# Patient Record
Sex: Female | Born: 1990 | Race: White | Hispanic: No | Marital: Single | Smoking: Never smoker
Health system: Southern US, Community
[De-identification: ages and names within clinical notes are randomized; demographics above are authoritative.]

---

## 2014-05-25 ENCOUNTER — Emergency Department (HOSPITAL_COMMUNITY)
Admission: EM | Admit: 2014-05-25 | Discharge: 2014-05-26 | Disposition: A | Discharge status: Home / Self Care | Payer: BC Managed Care – PPO | Attending: Emergency Medicine | Admitting: Emergency Medicine

## 2014-05-25 ENCOUNTER — Encounter (HOSPITAL_COMMUNITY): Payer: Self-pay | Admitting: Emergency Medicine

## 2014-05-25 DIAGNOSIS — A499 Bacterial infection, unspecified: Secondary | ICD-10-CM | POA: Insufficient documentation

## 2014-05-25 DIAGNOSIS — N760 Acute vaginitis: Secondary | ICD-10-CM | POA: Insufficient documentation

## 2014-05-25 DIAGNOSIS — B9689 Other specified bacterial agents as the cause of diseases classified elsewhere: Secondary | ICD-10-CM | POA: Insufficient documentation

## 2014-05-25 DIAGNOSIS — N3000 Acute cystitis without hematuria: Secondary | ICD-10-CM | POA: Insufficient documentation

## 2014-05-25 LAB — URINALYSIS, ROUTINE W REFLEX MICROSCOPIC
Bilirubin Urine: NEGATIVE
Glucose, UA: NEGATIVE mg/dL
HGB URINE DIPSTICK: NEGATIVE
Ketones, ur: NEGATIVE mg/dL
Nitrite: POSITIVE — AB
PH: 5.5 (ref 5.0–8.0)
Protein, ur: NEGATIVE mg/dL
Specific Gravity, Urine: 1.008 (ref 1.005–1.030)
Urobilinogen, UA: 1 mg/dL (ref 0.0–1.0)

## 2014-05-25 LAB — URINE MICROSCOPIC-ADD ON

## 2014-05-25 NOTE — ED Notes (Signed)
Pt reports lower back pain since Sunday. Pt reports taking a "home UTI test", which she reports was positive. Pt reports dysuria. Pt reports having a thick, brown colored vaginal discharge, which also started on Sunday. Pt denies vaginal bleeding. Pt is A/O x4, in NAD, and vitals are WDL.

## 2014-05-26 ENCOUNTER — Emergency Department (HOSPITAL_COMMUNITY): Payer: BC Managed Care – PPO

## 2014-05-26 LAB — GC/CHLAMYDIA PROBE AMP
CT PROBE, AMP APTIMA: NEGATIVE
GC PROBE AMP APTIMA: NEGATIVE

## 2014-05-26 LAB — WET PREP, GENITAL
Trich, Wet Prep: NONE SEEN
Yeast Wet Prep HPF POC: NONE SEEN

## 2014-05-26 LAB — HIV ANTIBODY (ROUTINE TESTING W REFLEX): HIV 1&2 Ab, 4th Generation: NONREACTIVE

## 2014-05-26 MED ORDER — CEPHALEXIN 500 MG PO CAPS
500.0000 mg | ORAL_CAPSULE | Freq: Once | ORAL | Status: AC
  Administered 2014-05-26: 500 mg via ORAL
  Filled 2014-05-26: qty 1

## 2014-05-26 MED ORDER — METRONIDAZOLE 500 MG PO TABS: 500.0000 mg | ORAL_TABLET | Freq: Two times a day (BID) | ORAL | Status: AC

## 2014-05-26 MED ORDER — CEPHALEXIN 500 MG PO CAPS: 500.0000 mg | ORAL_CAPSULE | Freq: Four times a day (QID) | ORAL | Status: AC

## 2014-05-26 NOTE — Discharge Instructions (Signed)
Return to the ED with any concerns including fever/chills, vomiting and not able to keep down liquids or antibiotics, abdominal pain, decreased level of alertness/lethargy, or any other alarming symptoms  The ultrasound of your pelvis was normal, there were no ovarian cysts or other abnormalities identified  Gonorrhea, Chlamydia and HIV tests are pending at the time of your discharge, if these are positive you will be contacted to be notified of these results

## 2014-05-26 NOTE — ED Notes (Signed)
MD at bedside for pelvic exam Patient informed of order for [place]

## 2014-05-26 NOTE — ED Notes (Signed)
US tech at bedside and testing in progress

## 2014-05-26 NOTE — ED Provider Notes (Signed)
CSN: [id number]     Arrival date & time 05/25/14  2216 History   First MD Initiated Contact with Patient 05/26/14 0051     Chief Complaint  Patient presents with  . Dysuria  . Vaginal Discharge     (Consider location/radiation/quality/duration/timing/severity/associated sxs/prior Treatment) HPI Pt presenting with c/o right flank pain as well as dysuria.  She also reports having a thick brown discharge from vagina over the past couple of days.  Also c/o right pelvic pain, has hx of ovarian cyst and has noted right sided pelvic pain over the past few days as well.  No fever/chills, no vomiting.  No blood in urine.  LMP 2 weeks ago.  No recent antibiotics.  Tried azo for urinary symptoms which did not help much.  There are no other associated systemic symptoms, there are no other alleviating or modifying factors.   History reviewed. No pertinent past medical history. History reviewed. No pertinent past surgical history. No family history on file. History  Substance Use Topics  . Smoking status: Never Smoker   . Smokeless tobacco: Never Used  . Alcohol Use: Yes     Comment: Socially   OB History   Grav Para Term Preterm Abortions TAB SAB Ect Mult Living                 Review of Systems ROS reviewed and all otherwise negative except for mentioned in HPI    Allergies  Gluten meal  Home Medications   Prior to Admission medications   Medication Sig Start Date End Date Taking? Authorizing Provider  cephALEXin (KEFLEX) 500 MG capsule Take 1 capsule (500 mg total) by mouth 4 (four) times daily. 05/26/14   Ethelda ChickMartha K Linker, MD  metroNIDAZOLE (FLAGYL) 500 MG tablet Take 1 tablet (500 mg total) by mouth 2 (two) times daily. 05/26/14   Ethelda ChickMartha K Linker, MD  Norgestimate-Ethinyl Estradiol Triphasic (TRI-SPRINTEC) 0.18/0.215/0.25 MG-35 MCG tablet Take 1 tablet by mouth every evening.   Yes Historical Provider, MD  phenazopyridine (PYRIDIUM) 95 MG tablet Take 95 mg by mouth once.   Yes  Historical Provider, MD   BP 121/61  Pulse 78  Temp(Src) 98.5 F (36.9 C) (Oral)  Resp 16  SpO2 100%  LMP 05/11/2014 Vitals reviewed Physical Exam Physical Examination: General appearance - alert, well appearing, and in no distress Mental status - alert, oriented to person, place, and time Eyes - no conjunctival injection, no scleral icterus Mouth - mucous membranes moist, pharynx normal without lesions Chest - clear to auscultation, no wheezes, rales or rhonchi, symmetric air entry Heart - normal rate, regular rhythm, normal S1, S2, no murmurs, rubs, clicks or gallops Abdomen - soft, nontender, nondistended, no masses or organomegaly Pelvic - normal external genitalia, vulva, vagina, cervix, uterus, adnexa, no CMT, no adnexal tenderness Back exam - full range of motion, no tenderness, palpable spasm or pain on motion Extremities - peripheral pulses normal, no pedal edema, no clubbing or cyanosis Skin - normal coloration and turgor, no rashes  ED Course  Procedures (including critical care time) Labs Review Labs Reviewed  WET PREP, GENITAL - Abnormal; Notable for the following:    Clue Cells Wet Prep HPF POC MODERATE (*)    WBC, Wet Prep HPF POC MODERATE (*)    All other components within normal limits  URINALYSIS, ROUTINE W REFLEX MICROSCOPIC - Abnormal; Notable for the following:    Color, Urine ORANGE (*)    APPearance HAZY (*)    Nitrite POSITIVE (*)  Leukocytes, UA SMALL (*)    All other components within normal limits  URINE MICROSCOPIC-ADD ON - Abnormal; Notable for the following:    Squamous Epithelial / LPF FEW (*)    All other components within normal limits  GC/CHLAMYDIA PROBE AMP  HIV ANTIBODY (ROUTINE TESTING)    Imaging Review No results found.   EKG Interpretation None      MDM   Final diagnoses:  Acute cystitis without hematuria  Bacterial vaginosis    Pt presenting with c/o dysuria and vaginal discharge.  UA c/w UTI- pt started on abx for  this.  Wet prep c/w BV   Pt had normal pelvic ultrasound.  Discharged with strict return precautions.  Pt agreeable with plan.    Ethelda ChickMartha K Linker, MD 05/30/14 1020

## 2015-09-19 IMAGING — US US TRANSVAGINAL NON-OB
1 series · 13 of 25 positions shown · non-contrast
Comparison: None.

CLINICAL DATA: Pelvic tenderness.

EXAM:
TRANSABDOMINAL AND TRANSVAGINAL ULTRASOUND OF PELVIS
DOPPLER ULTRASOUND OF OVARIES
TECHNIQUE: Both transabdominal and transvaginal ultrasound examinations of the
pelvis were performed. Transabdominal technique was performed for
global imaging of the pelvis including uterus, ovaries, adnexal
regions, and pelvic cul-de-sac.
It was necessary to proceed with endovaginal exam following the
transabdominal exam to visualize the uterus and ovaries. Color and
duplex Doppler ultrasound was utilized to evaluate blood flow to the
ovaries.

[Series 1: us transvaginal non-ob · 0.20mm/px · 13 of 37 slices shown]
[im 1/37]
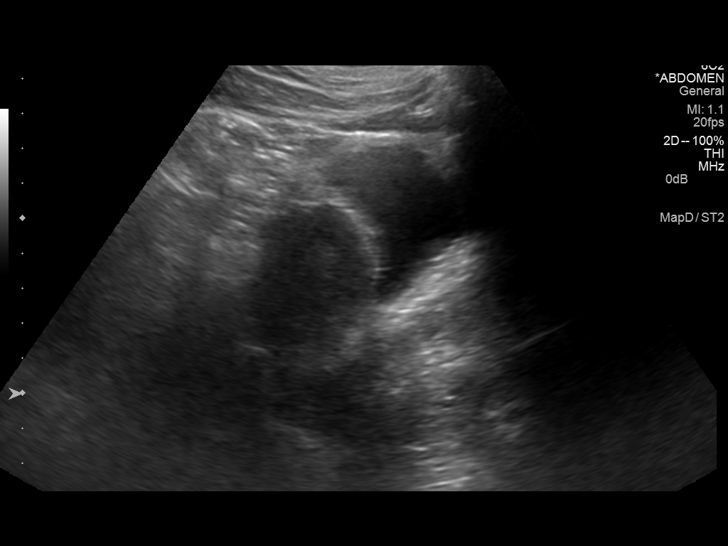
[im 4/37]
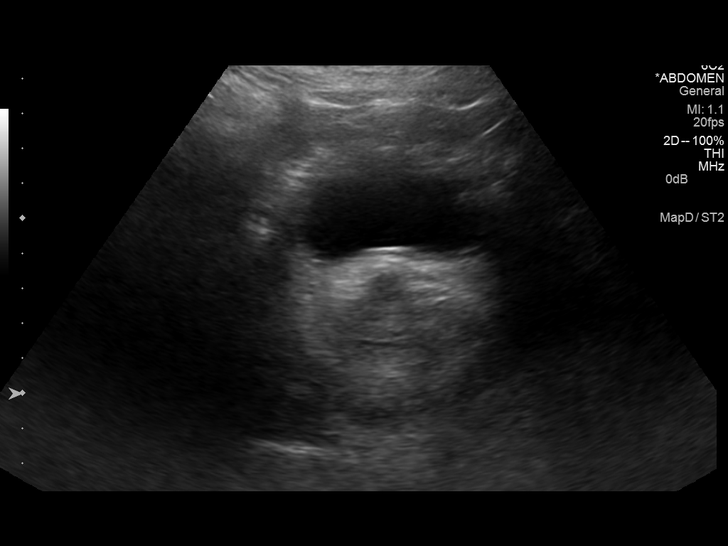
[im 7/37]
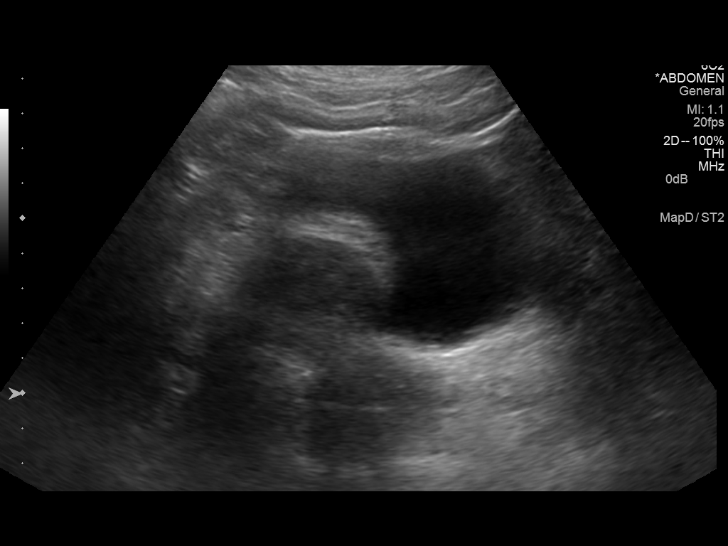
[im 10/37]
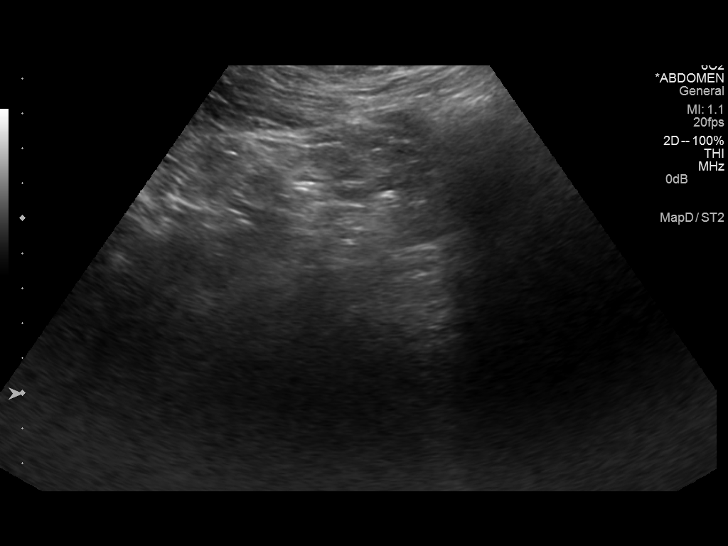
[im 13/37]
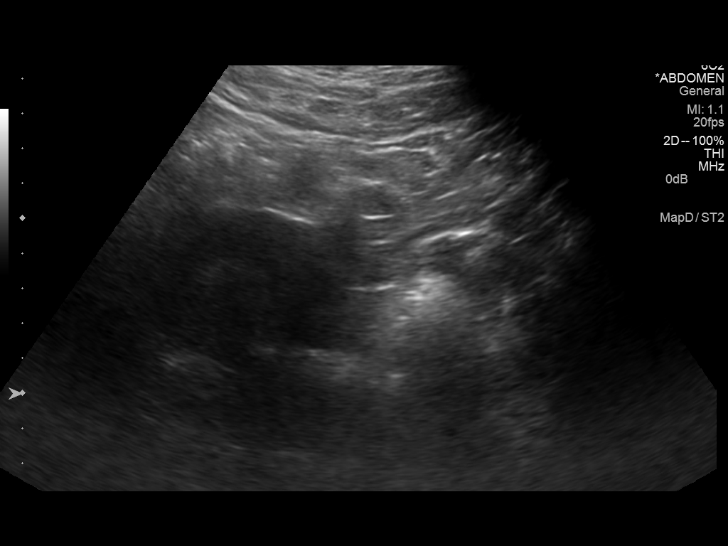
[im 16/37]
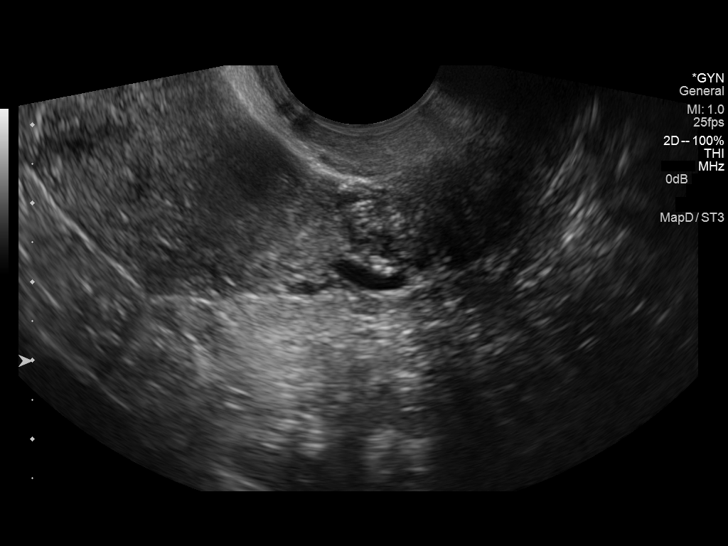
[im 19/37]
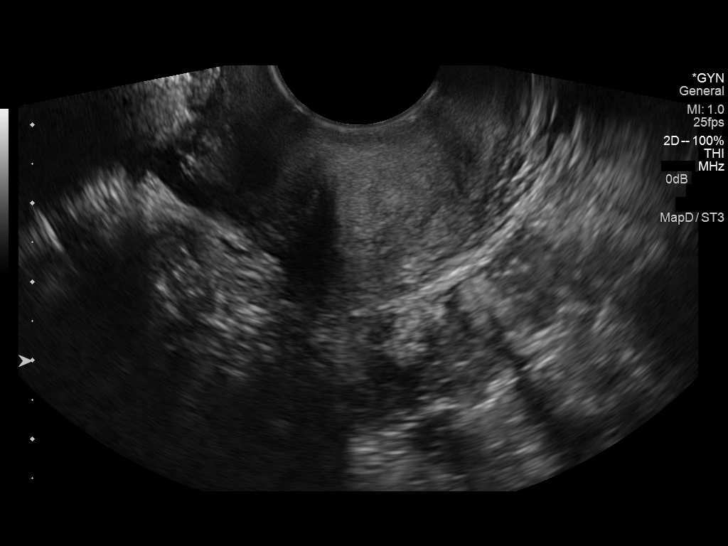
[im 22/37]
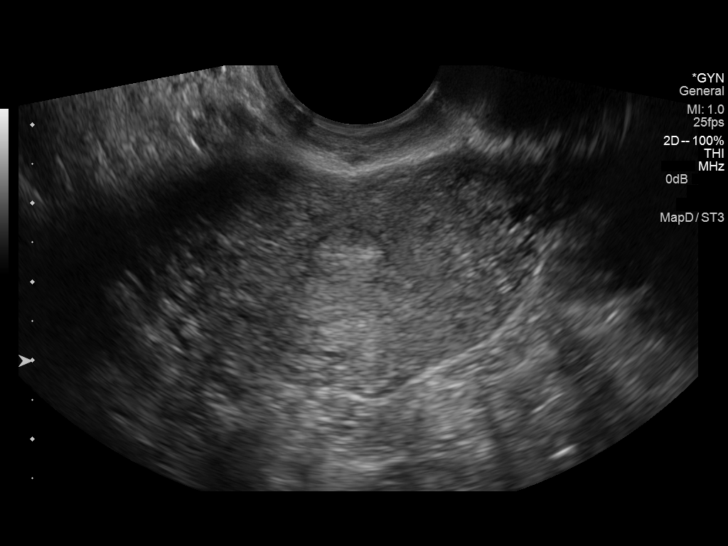
[im 25/37]
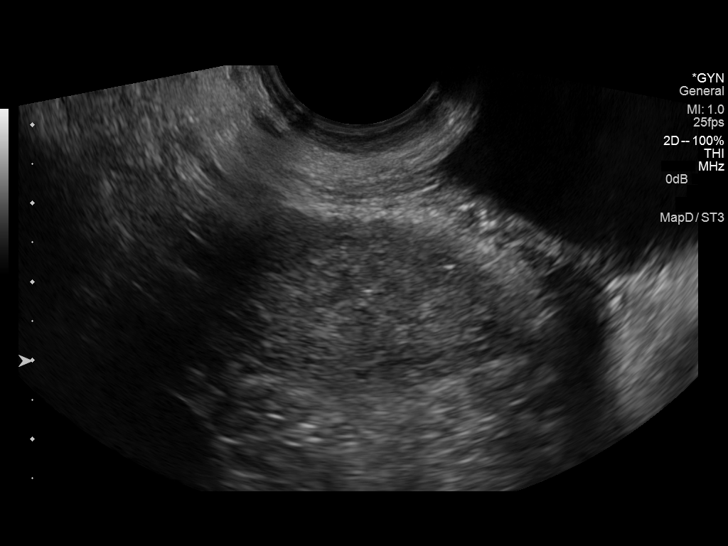
[im 28/37]
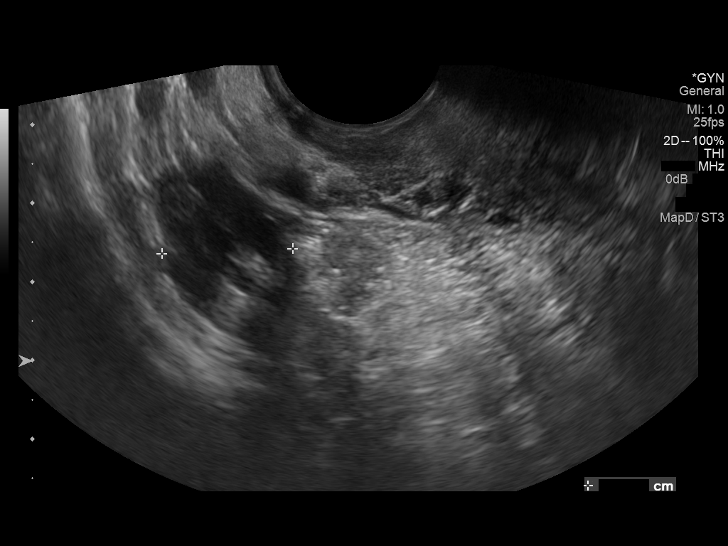
[im 31/37]
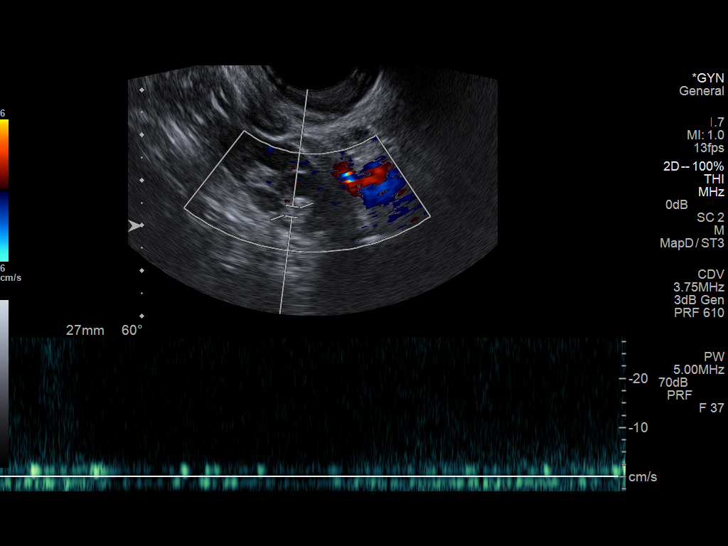
[im 34/37]
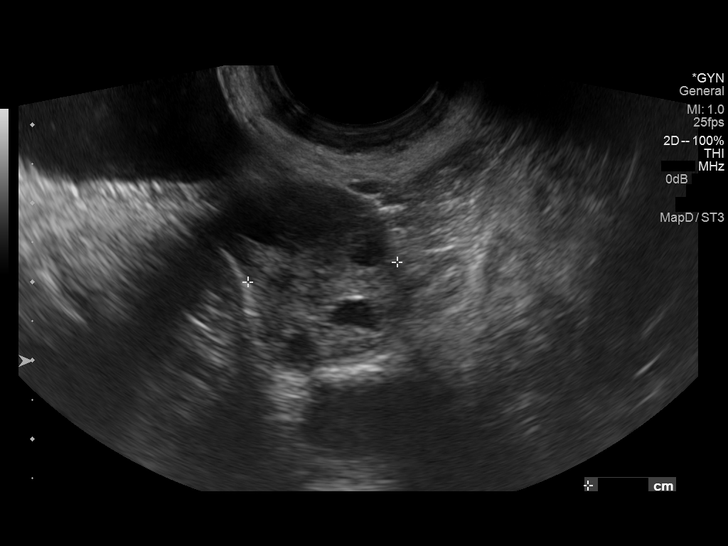
[im 37/37]
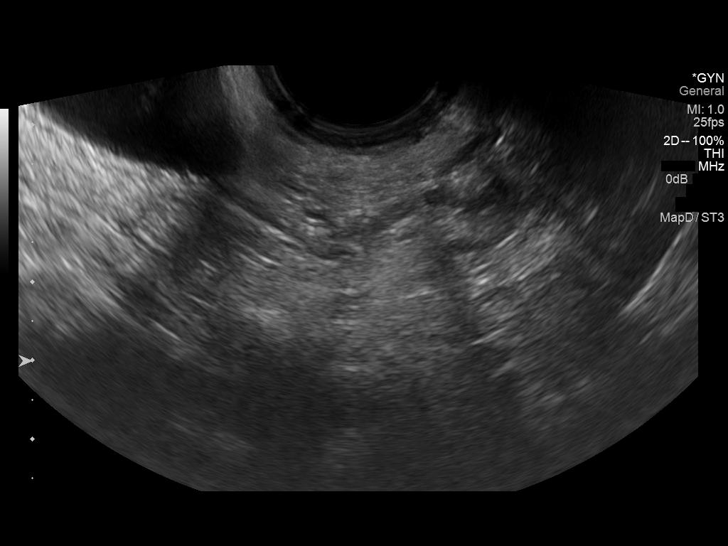

[13 of 25 positions shown; findings below may reference images not displayed]

FINDINGS: Uterus

Measurements: 7.7 x 3.1 x 5.4 cm, anteverted. No fibroids or other
mass visualized.

Endometrium

Thickness: 5 mm.  No focal abnormality visualized.

Right ovary

Measurements: 3.7 x 1.6 x 1.7 cm. Normal appearance/no adnexal mass.

Left ovary

Measurements: 3 x 2.3 x 1.9 cm. Normal appearance/no adnexal mass.

Pulsed Doppler evaluation of both ovaries demonstrates normal
low-resistance arterial and venous waveforms. Color flow Doppler
imaging demonstrates flow within both ovaries.

Other findings

Small amount of free fluid in the pelvis.
IMPRESSION: Normal ultrasound appearance of the uterus and ovaries. Small amount
of free fluid is demonstrated in the pelvis.

## 2016-08-07 ENCOUNTER — Telehealth: Payer: Self-pay

## 2016-08-07 NOTE — Telephone Encounter (Signed)
Called patient to set up a new patient app. No answer, I LVM to call back to set something up.

## 2016-08-08 NOTE — Telephone Encounter (Signed)
I called patient again today. No answer, LVM once again to call back.
